# Patient Record
Sex: Male | Born: 2005 | Race: Black or African American | Hispanic: No | Marital: Single | Smoking: Never smoker
Health system: Southern US, Community
[De-identification: ages and names within clinical notes are randomized; demographics above are authoritative.]

## PROBLEM LIST (undated history)

## (undated) HISTORY — PX: CIRCUMCISION: SUR203

---
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
## 2024-07-15 ENCOUNTER — Encounter (HOSPITAL_COMMUNITY): Payer: Self-pay | Admitting: Emergency Medicine

## 2024-07-15 ENCOUNTER — Ambulatory Visit (HOSPITAL_COMMUNITY): Admission: EM | Admit: 2024-07-15 | Discharge: 2024-07-15 | Disposition: A | Discharge status: Home / Self Care

## 2024-07-15 DIAGNOSIS — Z0289 Encounter for other administrative examinations: Secondary | ICD-10-CM | POA: Diagnosis not present

## 2024-07-15 NOTE — ED Triage Notes (Signed)
 Pt reports was out of work yesterday for vomiting. Reports fine now just needs a note to return for being out yesterday.

## 2024-07-15 NOTE — ED Provider Notes (Signed)
 [facility]    CSN: [id number] Arrival date & time: 07/15/24  1623      History   Chief Complaint Chief Complaint  Patient presents with   Letter for School/Work    HPI Brett Carroll is a 18 y.o. male.   Patient presents requesting a return to work note.  Patient states that he was out of work yesterday for vomiting.  Patient states that he feels fine now and has no symptoms today and really details of note to return to work tomorrow.  The history is provided by the patient and medical records.    History reviewed. No pertinent past medical history.  Patient Active Problem List   Diagnosis Date Noted   Tension headache 02/24/2015   Migraine without aura and without status migrainosus, not intractable 02/24/2015   Circadian rhythm sleep disorder, delayed sleep phase type 02/24/2015    Past Surgical History:  Procedure Laterality Date   CIRCUMCISION     FLEXOR TENDON REPAIR Right 10/13/2019   Procedure: RIGHT RING FINGER FLEXOR TENDON REPAIR;  Surgeon: Sebastian Lenis, MD;  Location: [facility];  Service: Orthopedics;  Laterality: Right;       Home Medications    Prior to Admission medications   Medication Sig Start Date End Date Taking? Authorizing Provider  acetaminophen  (TYLENOL ) 325 MG tablet Take 2 tablets (650 mg total) by mouth every 6 (six) hours. 10/13/19   Sebastian Lenis, MD  ibuprofen  (ADVIL ) 200 MG tablet Take 3 tablets (600 mg total) by mouth every 6 (six) hours. 10/13/19   Sebastian Lenis, MD  oxyCODONE  (ROXICODONE ) 5 MG immediate release tablet Take 1 tablet (5 mg total) by mouth every 6 (six) hours as needed for severe pain. 10/13/19   Sebastian Lenis, MD    Family History Family History  Problem Relation Age of Onset   Migraines Mother    Migraines Father        Resolved as a teen   Migraines Maternal Grandmother     Social History Social History   Tobacco Use   Smoking status: Never   Smokeless tobacco: Never   Vaping Use   Vaping status: Never Used  Substance Use Topics   Alcohol use: No   Drug use: No     Allergies   Other   Review of Systems Review of Systems  Per HPI  Physical Exam Triage Vital Signs ED Triage Vitals [07/15/24 1653]  Encounter Vitals Group     BP 113/60     Girls Systolic BP Percentile      Girls Diastolic BP Percentile      Boys Systolic BP Percentile      Boys Diastolic BP Percentile      Pulse Rate 60     Resp 16     Temp 98.1 F (36.7 C)     Temp Source Oral     SpO2 97 %     Weight      Height      Head Circumference      Peak Flow      Pain Score 0     Pain Loc      Pain Education      Exclude from Growth Chart    No data found.  Updated Vital Signs BP 113/60 (BP Location: Right Arm)   Pulse 60   Temp 98.1 F (36.7 C) (Oral)   Resp 16   SpO2 97%   Visual Acuity Right Eye Distance:  Left Eye Distance:   Bilateral Distance:    Right Eye Near:   Left Eye Near:    Bilateral Near:     Physical Exam Vitals and nursing note reviewed.  Constitutional:      General: He is awake. He is not in acute distress.    Appearance: Normal appearance. He is well-developed and well-groomed. He is not ill-appearing.  Neurological:     General: No focal deficit present.     Mental Status: He is alert and oriented to person, place, and time. Mental status is at baseline.  Psychiatric:        Behavior: Behavior is cooperative.      UC Treatments / Results  Labs (all labs ordered are listed, but only abnormal results are displayed) Labs Reviewed - No data to display  EKG   Radiology No results found.  Procedures Procedures (including critical care time)  Medications Ordered in UC Medications - No data to display  Initial Impression / Assessment and Plan / UC Course  I have reviewed the triage vital signs and the nursing notes.  Pertinent labs & imaging results that were available during my care of the patient were reviewed by  me and considered in my medical decision making (see chart for details).     Patient is overall well-appearing.  Vitals are stable.  Provided patient with work note as requested.  Discussed follow-up and return precautions. Final Clinical Impressions(s) / UC Diagnoses   Final diagnoses:  Encounter to obtain excuse from work     Discharge Instructions      You may return to work tomorrow.  Your work note is attached to the back of your paperwork.    ED Prescriptions   None    PDMP not reviewed this encounter.   Johnie Flaming A, NP 07/15/24 1711

## 2024-07-15 NOTE — Discharge Instructions (Signed)
 You may return to work tomorrow.  Your work note is attached to the back of your paperwork.

## 2024-07-28 ENCOUNTER — Ambulatory Visit (HOSPITAL_COMMUNITY): Admission: EM | Admit: 2024-07-28 | Discharge: 2024-07-28 | Disposition: A | Discharge status: Home / Self Care

## 2024-07-28 ENCOUNTER — Encounter (HOSPITAL_COMMUNITY): Payer: Self-pay

## 2024-07-28 DIAGNOSIS — R110 Nausea: Secondary | ICD-10-CM

## 2024-07-28 NOTE — ED Triage Notes (Signed)
 Patient here today with c/o nausea X 2 days. He has taken Tylenol  with some relief. Denies abd pain but states that he did vomit a couple times. Patient states that he is feeling better but was out of work and needs a work note.

## 2024-07-28 NOTE — ED Provider Notes (Signed)
 [facility]    CSN: [id number] Arrival date & time: 07/28/24  1724      History   Chief Complaint Chief Complaint  Patient presents with   Nausea    HPI Brett ROMANOSKI is a 18 y.o. male.   Patient reports for the last 2 to 3 days he has been experiencing nausea and vomiting.  He reports today symptoms have a completely resolved.  He denies abdominal pain, fever, chills, body aches.  He reports he missed work and needs a note to return.    History reviewed. No pertinent past medical history.  Patient Active Problem List   Diagnosis Date Noted   Tension headache 02/24/2015   Migraine without aura and without status migrainosus, not intractable 02/24/2015   Circadian rhythm sleep disorder, delayed sleep phase type 02/24/2015    Past Surgical History:  Procedure Laterality Date   CIRCUMCISION     FLEXOR TENDON REPAIR Right 10/13/2019   Procedure: RIGHT RING FINGER FLEXOR TENDON REPAIR;  Surgeon: Sebastian Lenis, MD;  Location: [facility];  Service: Orthopedics;  Laterality: Right;       Home Medications    Prior to Admission medications   Medication Sig Start Date End Date Taking? Authorizing Provider  acetaminophen  (TYLENOL ) 325 MG tablet Take 2 tablets (650 mg total) by mouth every 6 (six) hours. 10/13/19   Sebastian Lenis, MD  ibuprofen  (ADVIL ) 200 MG tablet Take 3 tablets (600 mg total) by mouth every 6 (six) hours. 10/13/19   Sebastian Lenis, MD    Family History Family History  Problem Relation Age of Onset   Migraines Mother    Migraines Father        Resolved as a teen   Migraines Maternal Grandmother     Social History Social History   Tobacco Use   Smoking status: Never   Smokeless tobacco: Never  Vaping Use   Vaping status: Never Used  Substance Use Topics   Alcohol use: No   Drug use: No     Allergies   Other   Review of Systems Review of Systems  Constitutional:  Negative for chills and fever.  HENT:   Negative for ear pain and sore throat.   Eyes:  Negative for pain and visual disturbance.  Respiratory:  Negative for cough and shortness of breath.   Cardiovascular:  Negative for chest pain and palpitations.  Gastrointestinal:  Positive for nausea and vomiting. Negative for abdominal pain.  Genitourinary:  Negative for dysuria and hematuria.  Musculoskeletal:  Negative for arthralgias and back pain.  Skin:  Negative for color change and rash.  Neurological:  Negative for seizures and syncope.  All other systems reviewed and are negative.    Physical Exam Triage Vital Signs ED Triage Vitals [07/28/24 1846]  Encounter Vitals Group     BP 121/65     Girls Systolic BP Percentile      Girls Diastolic BP Percentile      Boys Systolic BP Percentile      Boys Diastolic BP Percentile      Pulse Rate (!) 53     Resp 16     Temp 98.2 F (36.8 C)     Temp Source Oral     SpO2 97 %     Weight      Height      Head Circumference      Peak Flow      Pain Score 0     Pain  Loc      Pain Education      Exclude from Growth Chart    No data found.  Updated Vital Signs BP 121/65 (BP Location: Right Arm)   Pulse (!) 53   Temp 98.2 F (36.8 C) (Oral)   Resp 16   SpO2 97%   Visual Acuity Right Eye Distance:   Left Eye Distance:   Bilateral Distance:    Right Eye Near:   Left Eye Near:    Bilateral Near:     Physical Exam Vitals and nursing note reviewed.  Constitutional:      General: He is not in acute distress.    Appearance: He is well-developed.  HENT:     Head: Normocephalic and atraumatic.  Eyes:     Conjunctiva/sclera: Conjunctivae normal.  Cardiovascular:     Rate and Rhythm: Normal rate and regular rhythm.     Heart sounds: No murmur heard. Pulmonary:     Effort: Pulmonary effort is normal. No respiratory distress.     Breath sounds: Normal breath sounds.  Abdominal:     Palpations: Abdomen is soft.     Tenderness: There is no abdominal tenderness.   Musculoskeletal:        General: No swelling.     Cervical back: Neck supple.  Skin:    General: Skin is warm and dry.     Capillary Refill: Capillary refill takes less than 2 seconds.  Neurological:     Mental Status: He is alert.  Psychiatric:        Mood and Affect: Mood normal.      UC Treatments / Results  Labs (all labs ordered are listed, but only abnormal results are displayed) Labs Reviewed - No data to display  EKG   Radiology No results found.  Procedures Procedures (including critical care time)  Medications Ordered in UC Medications - No data to display  Initial Impression / Assessment and Plan / UC Course  I have reviewed the triage vital signs and the nursing notes.  Pertinent labs & imaging results that were available during my care of the patient were reviewed by me and considered in my medical decision making (see chart for details).     Nausea, vomiting.  Symptoms have completely resolved.  Patient presents for work note. Final Clinical Impressions(s) / UC Diagnoses   Final diagnoses:  Nausea   Discharge Instructions   None    ED Prescriptions   None    PDMP not reviewed this encounter.   Ward, Harlene PEDLAR, PA-C 07/28/24 1910
